# Patient Record
Sex: Male | Born: 1955 | Race: White | Hispanic: No | Marital: Married | Smoking: Never smoker
Health system: Southern US, Community
[De-identification: ages and names within clinical notes are randomized; demographics above are authoritative.]

## PROBLEM LIST (undated history)

## (undated) DIAGNOSIS — M1990 Unspecified osteoarthritis, unspecified site: Secondary | ICD-10-CM

## (undated) DIAGNOSIS — I10 Essential (primary) hypertension: Secondary | ICD-10-CM

## (undated) HISTORY — PX: NO PAST SURGERIES: SHX2092

---

## 2001-01-26 ENCOUNTER — Ambulatory Visit (HOSPITAL_BASED_OUTPATIENT_CLINIC_OR_DEPARTMENT_OTHER): Admission: RE | Admit: 2001-01-26 | Discharge: 2001-01-26 | Payer: Self-pay | Admitting: *Deleted

## 2009-02-01 ENCOUNTER — Ambulatory Visit (HOSPITAL_COMMUNITY): Admission: RE | Admit: 2009-02-01 | Discharge: 2009-02-01 | Payer: Self-pay | Admitting: Family Medicine

## 2011-07-04 IMAGING — CR DG CHEST 2V
2 series · 2 of 2 positions shown · non-contrast
Comparison: None available.

CLINICAL DATA: Cough.  Abnormal physical examination of the left
chest.

CHEST - 2 VIEW

[view not recorded (1 of 2)]
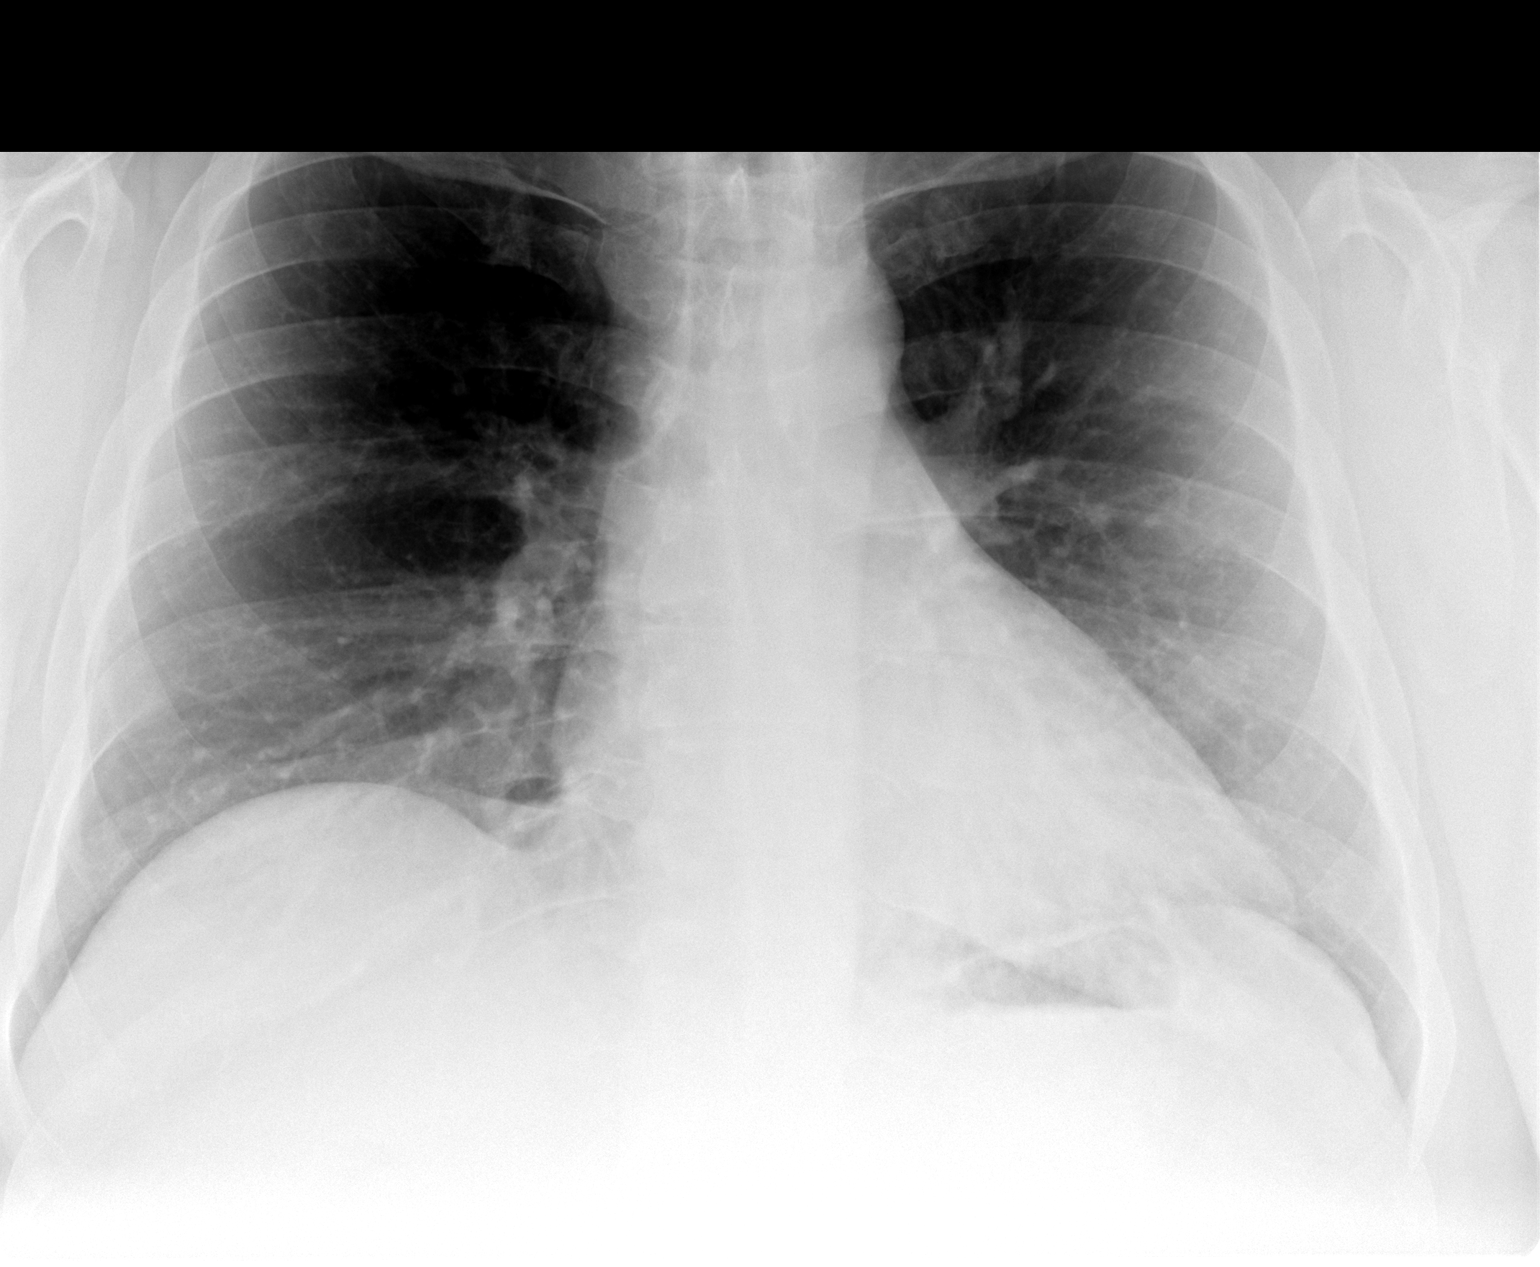

[view not recorded (2 of 2)]
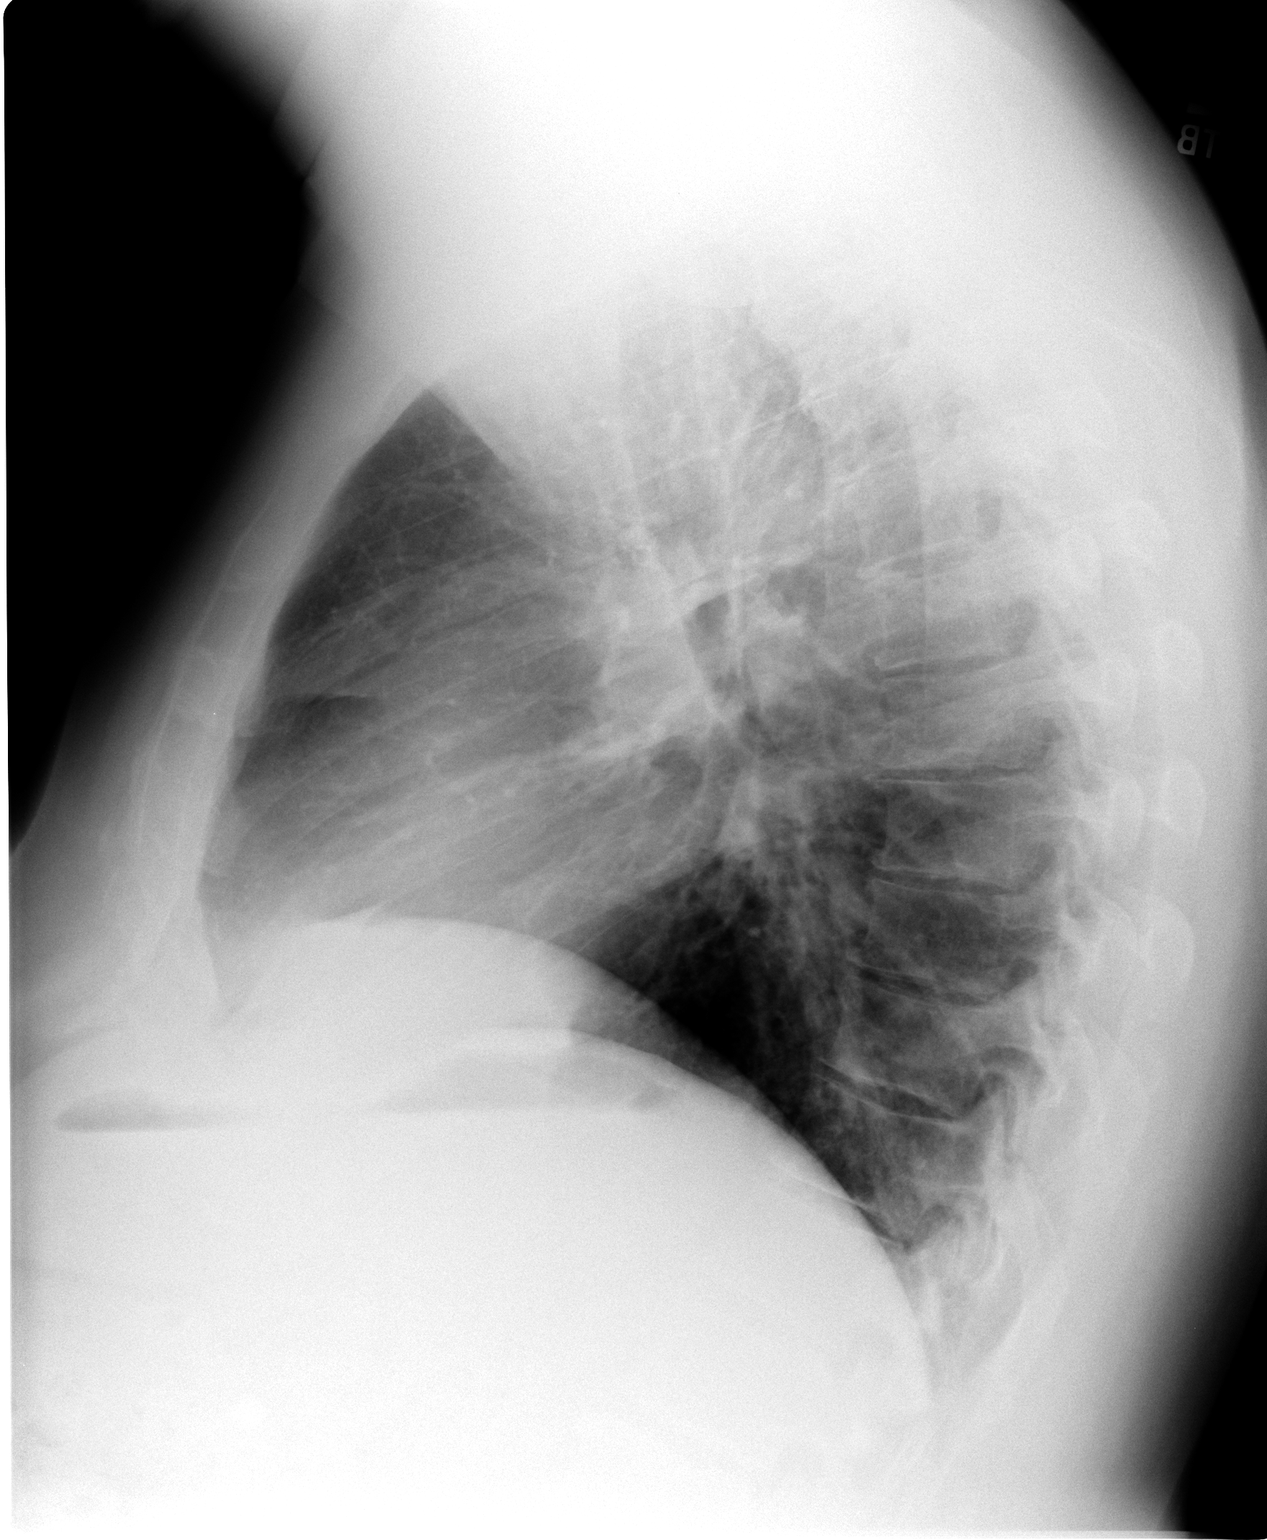

[2 of 2 positions shown; findings below may reference images not displayed]

FINDINGS: The lungs are clear.  There is mild peribronchial
thickening.  No pleural effusion.  Heart size normal.
IMPRESSION: Peribronchial thickening compatible with bronchitis.  No focal
airspace disease.

## 2016-03-25 ENCOUNTER — Ambulatory Visit: Payer: Self-pay | Admitting: Orthopedic Surgery

## 2016-04-07 ENCOUNTER — Ambulatory Visit: Payer: Self-pay | Admitting: Orthopedic Surgery

## 2016-04-07 NOTE — H&P (Signed)
TOTAL HIP ADMISSION H&P  Patient is admitted for right total hip arthroplasty.  Subjective:  Chief Complaint: right hip pain  HPI: Kevin Mcmahon, 60 y.o. male, has a history of pain and functional disability in the right hip(s) due to arthritis and patient has failed non-surgical conservative treatments for greater than 12 weeks to include NSAID's and/or analgesics, corticosteriod injections, flexibility and strengthening excercises, use of assistive devices, weight reduction as appropriate and activity modification.  Onset of symptoms was gradual starting 1 years ago with rapidlly worsening course since that time.The patient noted no past surgery on the right hip(s).  Patient currently rates pain in the right hip at 10 out of 10 with activity. Patient has night pain, worsening of pain with activity and weight bearing, pain that interfers with activities of daily living, pain with passive range of motion and crepitus. Patient has evidence of subchondral cysts, subchondral sclerosis, periarticular osteophytes and joint space narrowing by imaging studies. This condition presents safety issues increasing the risk of falls.  There is no current active infection.  There are no active problems to display for this patient.  No past medical history on file.  No past surgical history on file.   (Not in a hospital admission) Allergies not on file  Social History  Substance Use Topics  . Smoking status: Not on file  . Smokeless tobacco: Not on file  . Alcohol use Not on file    No family history on file.   Review of Systems  Constitutional: Negative.   HENT: Negative.   Eyes: Negative.   Respiratory: Negative.   Cardiovascular: Negative.   Gastrointestinal: Negative.   Genitourinary: Negative.   Musculoskeletal: Positive for joint pain.  Skin: Negative.   Neurological: Negative.   Endo/Heme/Allergies: Negative.   Psychiatric/Behavioral: Negative.     Objective:  Physical Exam   Vitals reviewed. Constitutional: He is oriented to person, place, and time. He appears well-developed and well-nourished.  HENT:  Head: Normocephalic and atraumatic.  Eyes: Conjunctivae and EOM are normal. Pupils are equal, round, and reactive to light.  Neck: Normal range of motion. Neck supple.  Cardiovascular: Normal rate, regular rhythm and intact distal pulses.   Respiratory: Effort normal and breath sounds normal. No respiratory distress.  GI: Soft. Bowel sounds are normal. He exhibits no distension.  Genitourinary:  Genitourinary Comments: deferred  Musculoskeletal:       Right hip: He exhibits decreased range of motion.  Neurological: He is alert and oriented to person, place, and time. He has normal reflexes.  Skin: Skin is warm and dry.  Psychiatric: He has a normal mood and affect. His behavior is normal. Judgment and thought content normal.    Vital signs in last 24 hours: @VSRANGES @  Labs:   There is no height or weight on file to calculate BMI.   Imaging Review Plain radiographs demonstrate severe degenerative joint disease of the right hip(s). The bone quality appears to be adequate for age and reported activity level.  Assessment/Plan:  End stage arthritis, right hip(s)  The patient history, physical examination, clinical judgement of the provider and imaging studies are consistent with end stage degenerative joint disease of the right hip(s) and total hip arthroplasty is deemed medically necessary. The treatment options including medical management, injection therapy, arthroscopy and arthroplasty were discussed at length. The risks and benefits of total hip arthroplasty were presented and reviewed. The risks due to aseptic loosening, infection, stiffness, dislocation/subluxation,  thromboembolic complications and other imponderables were discussed.  The patient acknowledged the explanation, agreed to proceed with the plan and consent was signed. Patient is being  admitted for inpatient treatment for surgery, pain control, PT, OT, prophylactic antibiotics, VTE prophylaxis, progressive ambulation and ADL's and discharge planning.The patient is planning to be discharged home with home health services

## 2016-04-17 ENCOUNTER — Other Ambulatory Visit (HOSPITAL_COMMUNITY): Payer: Self-pay | Admitting: *Deleted

## 2016-04-17 ENCOUNTER — Encounter (HOSPITAL_COMMUNITY)
Admission: RE | Admit: 2016-04-17 | Discharge: 2016-04-17 | Disposition: A | Discharge status: Home / Self Care | Payer: 59 | Source: Ambulatory Visit | Attending: Orthopedic Surgery | Admitting: Orthopedic Surgery

## 2016-04-17 ENCOUNTER — Encounter (HOSPITAL_COMMUNITY): Payer: Self-pay

## 2016-04-17 DIAGNOSIS — Z01818 Encounter for other preprocedural examination: Secondary | ICD-10-CM | POA: Diagnosis not present

## 2016-04-17 HISTORY — DX: Unspecified osteoarthritis, unspecified site: M19.90

## 2016-04-17 HISTORY — DX: Essential (primary) hypertension: I10

## 2016-04-17 LAB — CBC
HEMATOCRIT: 40.9 % (ref 39.0–52.0)
Hemoglobin: 13.3 g/dL (ref 13.0–17.0)
MCH: 27.8 pg (ref 26.0–34.0)
MCHC: 32.5 g/dL (ref 30.0–36.0)
MCV: 85.4 fL (ref 78.0–100.0)
Platelets: 214 10*3/uL (ref 150–400)
RBC: 4.79 MIL/uL (ref 4.22–5.81)
RDW: 13.9 % (ref 11.5–15.5)
WBC: 6.8 10*3/uL (ref 4.0–10.5)

## 2016-04-17 LAB — BASIC METABOLIC PANEL
ANION GAP: 7 (ref 5–15)
BUN: 19 mg/dL (ref 6–20)
CO2: 24 mmol/L (ref 22–32)
Calcium: 9 mg/dL (ref 8.9–10.3)
Chloride: 107 mmol/L (ref 101–111)
Creatinine, Ser: 0.83 mg/dL (ref 0.61–1.24)
GFR calc non Af Amer: >60 mL/min (ref >60)
Glucose, Bld: 112 mg/dL — ABNORMAL HIGH (ref 65–99)
Potassium: 3.9 mmol/L (ref 3.5–5.1)
SODIUM: 138 mmol/L (ref 135–145)

## 2016-04-17 LAB — TYPE AND SCREEN
ABO/RH(D): A POS
ANTIBODY SCREEN: NEGATIVE

## 2016-04-17 LAB — SURGICAL PCR SCREEN
MRSA, PCR: NEGATIVE
Staphylococcus aureus: NEGATIVE

## 2016-04-17 LAB — ABO/RH: ABO/RH(D): A POS

## 2016-04-17 NOTE — Progress Notes (Signed)
This pt. Has tested at an elevated risk for Obstructive Sleep Apnea during a pre-surgical visit. A score of 5 or greater is considered an elevated risk.

## 2016-04-17 NOTE — Pre-Procedure Instructions (Signed)
    Kevin Mcmahon  04/17/2016      [id number] - [place], [place] - [id number]  [place] [id number] Phone: [phone] Fax: [phone]   Your procedure is scheduled on 04-27-2016   Monday .  [facility] Admitting at 5:30 A.M   Call this number if you have problems the morning of surgery:  [phone]   Remember:  Do not eat food or drink liquids after midnight   Take these medicines the morning of surgery with A SIP OF WATER none             STOP ASPIRIN,ANTIINFLAMATORIES (IBUPROFEN,ALEVE,MOTRIN,ADVIL,GOODY'S POWDERS),HERBAL SUPPLEMENTS,FISH OIL,AND VITAMINS 5-7 DAYS PRIOR TO SURGERY     Do not wear jewelry,.  Do not wear lotions, powders, or perfumes, or deoderant.  Do not shave 48 hours prior to surgery.  Men may shave face and neck.   Do not bring valuables to the hospital.  [facility] is not responsible for any belongings or valuables.  Contacts, dentures or bridgework may not be worn into surgery.  Leave your suitcase in the car.  After surgery it may be brought to your room.  For patients admitted to the hospital, discharge time will be determined by your treatment team.  Patients discharged the day of surgery will not be allowed to drive home.    Special instructions:  See attached Sheet for instructions on CHG showers  Please read over the following fact sheets that you were given. Incentive Spirometry

## 2016-04-24 MED ORDER — TRANEXAMIC ACID 1000 MG/10ML IV SOLN
1000.0000 mg | INTRAVENOUS | Status: AC
  Administered 2016-04-27: 1000 mg via INTRAVENOUS
  Filled 2016-04-24: qty 10

## 2016-04-26 ENCOUNTER — Encounter (HOSPITAL_COMMUNITY): Payer: Self-pay | Admitting: Anesthesiology

## 2016-04-26 MED ORDER — DEXTROSE 5 % IV SOLN
3.0000 g | INTRAVENOUS | Status: AC
  Administered 2016-04-27: 3 g via INTRAVENOUS
  Filled 2016-04-26: qty 3000

## 2016-04-26 NOTE — Anesthesia Preprocedure Evaluation (Addendum)
Anesthesia Evaluation  Patient identified by MRN, date of birth, ID band Patient awake    Reviewed: Allergy & Precautions, NPO status , Patient's Chart, lab work & pertinent test results  Airway Mallampati: II       Dental no notable dental hx.    Pulmonary neg pulmonary ROS,    Pulmonary exam normal        Cardiovascular hypertension, Normal cardiovascular exam     Neuro/Psych negative neurological ROS  negative psych ROS   GI/Hepatic negative GI ROS, Neg liver ROS,   Endo/Other  Morbid obesity  Renal/GU negative Renal ROS     Musculoskeletal   Abdominal (+) + obese,   Peds negative pediatric ROS (+)  Hematology negative hematology ROS (+)   Anesthesia Other Findings   Reproductive/Obstetrics negative OB ROS                           Anesthesia Physical Anesthesia Plan  ASA: III  Anesthesia Plan: Spinal   Post-op Pain Management:    Induction:   Airway Management Planned:   Additional Equipment:   Intra-op Plan:   Post-operative Plan:   Informed Consent: I have reviewed the patients History and Physical, chart, labs and discussed the procedure including the risks, benefits and alternatives for the proposed anesthesia with the patient or authorized representative who has indicated his/her understanding and acceptance.     Plan Discussed with: CRNA and Surgeon  Anesthesia Plan Comments:        Anesthesia Quick Evaluation

## 2016-04-27 ENCOUNTER — Inpatient Hospital Stay (HOSPITAL_COMMUNITY): Payer: 59 | Admitting: Anesthesiology

## 2016-04-27 ENCOUNTER — Inpatient Hospital Stay (HOSPITAL_COMMUNITY): Payer: 59

## 2016-04-27 ENCOUNTER — Inpatient Hospital Stay (HOSPITAL_COMMUNITY)
Admission: RE | Admit: 2016-04-27 | Discharge: 2016-04-28 | DRG: 470 | Disposition: A | Discharge status: Home / Self Care | Payer: 59 | Source: Ambulatory Visit | Attending: Orthopedic Surgery | Admitting: Orthopedic Surgery

## 2016-04-27 ENCOUNTER — Encounter (HOSPITAL_COMMUNITY): Payer: Self-pay | Admitting: *Deleted

## 2016-04-27 ENCOUNTER — Encounter (HOSPITAL_COMMUNITY)
Admission: RE | Disposition: A | Discharge status: Home / Self Care | Payer: Self-pay | Source: Ambulatory Visit | Attending: Orthopedic Surgery

## 2016-04-27 DIAGNOSIS — M25551 Pain in right hip: Secondary | ICD-10-CM | POA: Diagnosis present

## 2016-04-27 DIAGNOSIS — I10 Essential (primary) hypertension: Secondary | ICD-10-CM | POA: Diagnosis present

## 2016-04-27 DIAGNOSIS — Z6839 Body mass index (BMI) 39.0-39.9, adult: Secondary | ICD-10-CM | POA: Diagnosis not present

## 2016-04-27 DIAGNOSIS — Z09 Encounter for follow-up examination after completed treatment for conditions other than malignant neoplasm: Secondary | ICD-10-CM

## 2016-04-27 DIAGNOSIS — Z419 Encounter for procedure for purposes other than remedying health state, unspecified: Secondary | ICD-10-CM

## 2016-04-27 DIAGNOSIS — M25751 Osteophyte, right hip: Secondary | ICD-10-CM | POA: Diagnosis present

## 2016-04-27 DIAGNOSIS — M1611 Unilateral primary osteoarthritis, right hip: Secondary | ICD-10-CM | POA: Diagnosis present

## 2016-04-27 HISTORY — PX: TOTAL HIP ARTHROPLASTY: SHX124

## 2016-04-27 SURGERY — ARTHROPLASTY, HIP, TOTAL, ANTERIOR APPROACH
Anesthesia: Spinal | Site: Hip | Laterality: Right

## 2016-04-27 MED ORDER — LIDOCAINE 2% (20 MG/ML) 5 ML SYRINGE
INTRAMUSCULAR | Status: AC
  Filled 2016-04-27: qty 5

## 2016-04-27 MED ORDER — MENTHOL 3 MG MT LOZG: 1.0000 | LOZENGE | OROMUCOSAL | Status: DC | PRN

## 2016-04-27 MED ORDER — CHLORHEXIDINE GLUCONATE 4 % EX LIQD: 60.0000 mL | Freq: Once | CUTANEOUS | Status: DC

## 2016-04-27 MED ORDER — DEXAMETHASONE SODIUM PHOSPHATE 10 MG/ML IJ SOLN
10.0000 mg | Freq: Once | INTRAMUSCULAR | Status: AC
Start: 2016-04-28 — End: 2016-04-28
  Administered 2016-04-28: 10 mg via INTRAVENOUS
  Filled 2016-04-27: qty 1

## 2016-04-27 MED ORDER — BUPIVACAINE IN DEXTROSE 0.75-8.25 % IT SOLN
INTRATHECAL | Status: DC | PRN
  Administered 2016-04-27: 2 mL via INTRATHECAL

## 2016-04-27 MED ORDER — SODIUM CHLORIDE 0.9 % IV SOLN
INTRAVENOUS | Status: DC
Start: 2016-04-27 — End: 2016-04-28
  Administered 2016-04-27: 16:00:00 via INTRAVENOUS

## 2016-04-27 MED ORDER — KETOROLAC TROMETHAMINE 30 MG/ML IJ SOLN: 30.0000 mg | Freq: Once | INTRAMUSCULAR | Status: DC

## 2016-04-27 MED ORDER — POVIDONE-IODINE 10 % EX SWAB: 2.0000 "application " | Freq: Once | CUTANEOUS | Status: DC

## 2016-04-27 MED ORDER — MEPERIDINE HCL 25 MG/ML IJ SOLN: 6.2500 mg | INTRAMUSCULAR | Status: DC | PRN

## 2016-04-27 MED ORDER — FENTANYL CITRATE (PF) 100 MCG/2ML IJ SOLN
INTRAMUSCULAR | Status: DC | PRN
  Administered 2016-04-27 (×2): 25 ug via INTRAVENOUS
  Administered 2016-04-27: 50 ug via INTRAVENOUS

## 2016-04-27 MED ORDER — KETOROLAC TROMETHAMINE 15 MG/ML IJ SOLN
15.0000 mg | Freq: Four times a day (QID) | INTRAMUSCULAR | Status: AC
  Administered 2016-04-28 (×2): 15 mg via INTRAVENOUS
  Filled 2016-04-27 (×3): qty 1

## 2016-04-27 MED ORDER — HYDROMORPHONE HCL 1 MG/ML IJ SOLN
0.5000 mg | INTRAMUSCULAR | Status: DC | PRN
  Administered 2016-04-27: 1 mg via INTRAVENOUS
  Filled 2016-04-27: qty 1

## 2016-04-27 MED ORDER — BUPIVACAINE-EPINEPHRINE 0.5% -1:200000 IJ SOLN
INTRAMUSCULAR | Status: DC | PRN
  Administered 2016-04-27: 30 mL

## 2016-04-27 MED ORDER — PROPOFOL 500 MG/50ML IV EMUL
INTRAVENOUS | Status: DC | PRN
  Administered 2016-04-27: 75 ug/kg/min via INTRAVENOUS

## 2016-04-27 MED ORDER — LACTATED RINGERS IV SOLN
INTRAVENOUS | Status: DC | PRN
  Administered 2016-04-27 (×2): via INTRAVENOUS

## 2016-04-27 MED ORDER — EPHEDRINE SULFATE 50 MG/ML IJ SOLN
INTRAMUSCULAR | Status: DC | PRN
  Administered 2016-04-27 (×3): 10 mg via INTRAVENOUS

## 2016-04-27 MED ORDER — HYDROMORPHONE HCL 1 MG/ML IJ SOLN
INTRAMUSCULAR | Status: AC
  Filled 2016-04-27: qty 1

## 2016-04-27 MED ORDER — HYDROCODONE-ACETAMINOPHEN 5-325 MG PO TABS
1.0000 | ORAL_TABLET | ORAL | Status: DC | PRN
  Administered 2016-04-27: 1 via ORAL
  Filled 2016-04-27: qty 2
  Filled 2016-04-27: qty 1

## 2016-04-27 MED ORDER — SENNA 8.6 MG PO TABS
2.0000 | ORAL_TABLET | Freq: Every day | ORAL | Status: DC
  Administered 2016-04-27: 17.2 mg via ORAL
  Filled 2016-04-27: qty 2

## 2016-04-27 MED ORDER — METHOCARBAMOL 500 MG PO TABS
500.0000 mg | ORAL_TABLET | Freq: Four times a day (QID) | ORAL | Status: DC | PRN
  Filled 2016-04-27: qty 1

## 2016-04-27 MED ORDER — ONDANSETRON HCL 4 MG PO TABS: 4.0000 mg | ORAL_TABLET | Freq: Four times a day (QID) | ORAL | Status: DC | PRN

## 2016-04-27 MED ORDER — ASPIRIN 81 MG PO CHEW
81.0000 mg | CHEWABLE_TABLET | Freq: Two times a day (BID) | ORAL | Status: DC
  Administered 2016-04-27 – 2016-04-28 (×2): 81 mg via ORAL
  Filled 2016-04-27 (×2): qty 1

## 2016-04-27 MED ORDER — PROMETHAZINE HCL 25 MG/ML IJ SOLN: 6.2500 mg | INTRAMUSCULAR | Status: DC | PRN

## 2016-04-27 MED ORDER — PROPOFOL 10 MG/ML IV BOLUS
INTRAVENOUS | Status: DC | PRN
  Administered 2016-04-27: 20 mg via INTRAVENOUS

## 2016-04-27 MED ORDER — BUPIVACAINE LIPOSOME 1.3 % IJ SUSP
INTRAMUSCULAR | Status: DC | PRN
  Administered 2016-04-27: 20 mL

## 2016-04-27 MED ORDER — EPHEDRINE 5 MG/ML INJ
INTRAVENOUS | Status: AC
  Filled 2016-04-27: qty 10

## 2016-04-27 MED ORDER — HYDROMORPHONE HCL 1 MG/ML IJ SOLN
2.0000 mg | INTRAMUSCULAR | Status: DC | PRN
  Administered 2016-04-27: 0.5 mg via INTRAVENOUS

## 2016-04-27 MED ORDER — PHENYLEPHRINE 40 MCG/ML (10ML) SYRINGE FOR IV PUSH (FOR BLOOD PRESSURE SUPPORT)
PREFILLED_SYRINGE | INTRAVENOUS | Status: AC
Start: 2016-04-27 — End: 2016-04-27
  Filled 2016-04-27: qty 10

## 2016-04-27 MED ORDER — BUPIVACAINE-EPINEPHRINE (PF) 0.5% -1:200000 IJ SOLN
INTRAMUSCULAR | Status: AC
Start: 2016-04-27 — End: 2016-04-27
  Filled 2016-04-27: qty 30

## 2016-04-27 MED ORDER — METOCLOPRAMIDE HCL 5 MG PO TABS: 5.0000 mg | ORAL_TABLET | Freq: Three times a day (TID) | ORAL | Status: DC | PRN

## 2016-04-27 MED ORDER — SODIUM CHLORIDE 0.9 % IR SOLN
Status: DC | PRN
  Administered 2016-04-27: 3000 mL

## 2016-04-27 MED ORDER — KETOROLAC TROMETHAMINE 30 MG/ML IJ SOLN
INTRAMUSCULAR | Status: DC | PRN
  Administered 2016-04-27: 30 mg

## 2016-04-27 MED ORDER — ACETAMINOPHEN 10 MG/ML IV SOLN
1000.0000 mg | INTRAVENOUS | Status: AC
  Administered 2016-04-27: 1000 mg via INTRAVENOUS
  Filled 2016-04-27: qty 100

## 2016-04-27 MED ORDER — CEFAZOLIN SODIUM-DEXTROSE 2-4 GM/100ML-% IV SOLN
2.0000 g | Freq: Four times a day (QID) | INTRAVENOUS | Status: AC
  Administered 2016-04-27: 2 g via INTRAVENOUS
  Filled 2016-04-27 (×2): qty 100

## 2016-04-27 MED ORDER — SODIUM CHLORIDE 0.9 % IV SOLN
INTRAVENOUS | Status: DC | PRN
  Administered 2016-04-27: 1000 mL

## 2016-04-27 MED ORDER — SODIUM CHLORIDE 0.9 % IV SOLN: INTRAVENOUS | Status: DC

## 2016-04-27 MED ORDER — PROPOFOL 10 MG/ML IV BOLUS
INTRAVENOUS | Status: AC
  Filled 2016-04-27: qty 20

## 2016-04-27 MED ORDER — DOCUSATE SODIUM 100 MG PO CAPS
100.0000 mg | ORAL_CAPSULE | Freq: Two times a day (BID) | ORAL | Status: DC
  Administered 2016-04-27 – 2016-04-28 (×2): 100 mg via ORAL
  Filled 2016-04-27 (×2): qty 1

## 2016-04-27 MED ORDER — TRANEXAMIC ACID 1000 MG/10ML IV SOLN
1000.0000 mg | Freq: Once | INTRAVENOUS | Status: AC
  Administered 2016-04-27: 1000 mg via INTRAVENOUS
  Filled 2016-04-27: qty 10

## 2016-04-27 MED ORDER — METOCLOPRAMIDE HCL 5 MG/ML IJ SOLN: 5.0000 mg | Freq: Three times a day (TID) | INTRAMUSCULAR | Status: DC | PRN

## 2016-04-27 MED ORDER — LIDOCAINE HCL (CARDIAC) 20 MG/ML IV SOLN
INTRAVENOUS | Status: DC | PRN
  Administered 2016-04-27: 40 mg via INTRATRACHEAL

## 2016-04-27 MED ORDER — HYDROMORPHONE HCL 1 MG/ML IJ SOLN: 0.2500 mg | INTRAMUSCULAR | Status: DC | PRN

## 2016-04-27 MED ORDER — ACETAMINOPHEN 325 MG PO TABS: 650.0000 mg | ORAL_TABLET | Freq: Four times a day (QID) | ORAL | Status: DC | PRN

## 2016-04-27 MED ORDER — METHOCARBAMOL 1000 MG/10ML IJ SOLN
500.0000 mg | Freq: Four times a day (QID) | INTRAMUSCULAR | Status: DC | PRN
  Filled 2016-04-27: qty 5

## 2016-04-27 MED ORDER — SODIUM CHLORIDE 0.9 % IJ SOLN
INTRAMUSCULAR | Status: DC | PRN
  Administered 2016-04-27: 30 mL

## 2016-04-27 MED ORDER — FENTANYL CITRATE (PF) 100 MCG/2ML IJ SOLN
INTRAMUSCULAR | Status: AC
  Filled 2016-04-27: qty 2

## 2016-04-27 MED ORDER — IRBESARTAN 150 MG PO TABS
150.0000 mg | ORAL_TABLET | Freq: Every day | ORAL | Status: DC
  Administered 2016-04-28: 150 mg via ORAL
  Filled 2016-04-27: qty 1

## 2016-04-27 MED ORDER — POLYETHYLENE GLYCOL 3350 17 G PO PACK: 17.0000 g | PACK | Freq: Every day | ORAL | Status: DC | PRN

## 2016-04-27 MED ORDER — ACETAMINOPHEN 650 MG RE SUPP: 650.0000 mg | Freq: Four times a day (QID) | RECTAL | Status: DC | PRN

## 2016-04-27 MED ORDER — KETOROLAC TROMETHAMINE 30 MG/ML IJ SOLN
INTRAMUSCULAR | Status: AC
  Filled 2016-04-27: qty 1

## 2016-04-27 MED ORDER — MIDAZOLAM HCL 2 MG/2ML IJ SOLN
INTRAMUSCULAR | Status: AC
  Filled 2016-04-27: qty 2

## 2016-04-27 MED ORDER — MIDAZOLAM HCL 5 MG/5ML IJ SOLN
INTRAMUSCULAR | Status: DC | PRN
  Administered 2016-04-27: 2 mg via INTRAVENOUS

## 2016-04-27 MED ORDER — 0.9 % SODIUM CHLORIDE (POUR BTL) OPTIME
TOPICAL | Status: DC | PRN
  Administered 2016-04-27: 1000 mL

## 2016-04-27 MED ORDER — PHENOL 1.4 % MT LIQD: 1.0000 | OROMUCOSAL | Status: DC | PRN

## 2016-04-27 MED ORDER — POVIDONE-IODINE 7.5 % EX SOLN
CUTANEOUS | Status: DC | PRN
  Administered 2016-04-27: 1 via TOPICAL

## 2016-04-27 MED ORDER — ONDANSETRON HCL 4 MG/2ML IJ SOLN: 4.0000 mg | Freq: Four times a day (QID) | INTRAMUSCULAR | Status: DC | PRN

## 2016-04-27 SURGICAL SUPPLY — 58 items
ADH SKN CLS APL DERMABOND .7 (GAUZE/BANDAGES/DRESSINGS) ×2
ADH SKN CLS LQ APL DERMABOND (GAUZE/BANDAGES/DRESSINGS) ×1
ALCOHOL ISOPROPYL (RUBBING) (MISCELLANEOUS) ×3 IMPLANT
BLADE SURG ROTATE 9660 (MISCELLANEOUS) IMPLANT
CAPT HIP TOTAL 2 ×2 IMPLANT
CHLORAPREP W/TINT 26ML (MISCELLANEOUS) ×3 IMPLANT
COVER SURGICAL LIGHT HANDLE (MISCELLANEOUS) ×3 IMPLANT
DERMABOND ADHESIVE PROPEN (GAUZE/BANDAGES/DRESSINGS) ×2
DERMABOND ADVANCED (GAUZE/BANDAGES/DRESSINGS) ×4
DERMABOND ADVANCED .7 DNX12 (GAUZE/BANDAGES/DRESSINGS) ×2 IMPLANT
DERMABOND ADVANCED .7 DNX6 (GAUZE/BANDAGES/DRESSINGS) IMPLANT
DRAPE C-ARM 42X72 X-RAY (DRAPES) ×3 IMPLANT
DRAPE IMP U-DRAPE 54X76 (DRAPES) ×6 IMPLANT
DRAPE STERI IOBAN 125X83 (DRAPES) ×3 IMPLANT
DRAPE U-SHAPE 47X51 STRL (DRAPES) ×9 IMPLANT
DRSG AQUACEL AG ADV 3.5X10 (GAUZE/BANDAGES/DRESSINGS) ×3 IMPLANT
ELECT BLADE 4.0 EZ CLEAN MEGAD (MISCELLANEOUS) ×3
ELECT REM PT RETURN 9FT ADLT (ELECTROSURGICAL) ×3
ELECTRODE BLDE 4.0 EZ CLN MEGD (MISCELLANEOUS) ×1 IMPLANT
ELECTRODE REM PT RTRN 9FT ADLT (ELECTROSURGICAL) ×1 IMPLANT
EVACUATOR 1/8 PVC DRAIN (DRAIN) IMPLANT
GLOVE BIO SURGEON STRL SZ8.5 (GLOVE) ×6 IMPLANT
GLOVE BIOGEL PI IND STRL 8.5 (GLOVE) ×1 IMPLANT
GLOVE BIOGEL PI INDICATOR 8.5 (GLOVE) ×2
GOWN STRL REUS W/ TWL LRG LVL3 (GOWN DISPOSABLE) ×2 IMPLANT
GOWN STRL REUS W/TWL 2XL LVL3 (GOWN DISPOSABLE) ×3 IMPLANT
GOWN STRL REUS W/TWL LRG LVL3 (GOWN DISPOSABLE) ×6
HANDPIECE INTERPULSE COAX TIP (DISPOSABLE) ×3
HOOD PEEL AWAY FACE SHEILD DIS (HOOD) ×6 IMPLANT
KIT BASIN OR (CUSTOM PROCEDURE TRAY) ×3 IMPLANT
KIT ROOM TURNOVER OR (KITS) ×3 IMPLANT
MANIFOLD NEPTUNE II (INSTRUMENTS) ×3 IMPLANT
MARKER SKIN DUAL TIP RULER LAB (MISCELLANEOUS) ×6 IMPLANT
NDL SPNL 18GX3.5 QUINCKE PK (NEEDLE) ×1 IMPLANT
NEEDLE SPNL 18GX3.5 QUINCKE PK (NEEDLE) ×3 IMPLANT
NS IRRIG 1000ML POUR BTL (IV SOLUTION) ×3 IMPLANT
PACK TOTAL JOINT (CUSTOM PROCEDURE TRAY) ×3 IMPLANT
PACK UNIVERSAL I (CUSTOM PROCEDURE TRAY) ×3 IMPLANT
PAD ARMBOARD 7.5X6 YLW CONV (MISCELLANEOUS) ×6 IMPLANT
SAW OSC TIP CART 19.5X105X1.3 (SAW) ×3 IMPLANT
SEALER BIPOLAR AQUA 6.0 (INSTRUMENTS) IMPLANT
SET HNDPC FAN SPRY TIP SCT (DISPOSABLE) ×1 IMPLANT
SOLUTION BETADINE 4OZ (MISCELLANEOUS) ×3 IMPLANT
SUCTION FRAZIER HANDLE 10FR (MISCELLANEOUS) ×2
SUCTION TUBE FRAZIER 10FR DISP (MISCELLANEOUS) ×1 IMPLANT
SUT ETHIBOND NAB CT1 #1 30IN (SUTURE) ×6 IMPLANT
SUT MNCRL AB 3-0 PS2 18 (SUTURE) ×3 IMPLANT
SUT MON AB 2-0 CT1 36 (SUTURE) ×3 IMPLANT
SUT VIC AB 1 CT1 27 (SUTURE) ×3
SUT VIC AB 1 CT1 27XBRD ANBCTR (SUTURE) ×1 IMPLANT
SUT VIC AB 2-0 CT1 27 (SUTURE) ×3
SUT VIC AB 2-0 CT1 TAPERPNT 27 (SUTURE) ×1 IMPLANT
SUT VLOC 180 0 24IN GS25 (SUTURE) ×3 IMPLANT
SYR 50ML LL SCALE MARK (SYRINGE) ×3 IMPLANT
TOWEL OR 17X24 6PK STRL BLUE (TOWEL DISPOSABLE) ×3 IMPLANT
TOWEL OR 17X26 10 PK STRL BLUE (TOWEL DISPOSABLE) ×3 IMPLANT
TRAY FOLEY CATH 16FR SILVER (SET/KITS/TRAYS/PACK) IMPLANT
WATER STERILE IRR 1000ML POUR (IV SOLUTION) ×9 IMPLANT

## 2016-04-27 NOTE — Addendum Note (Signed)
Addendum  created 04/27/16 2008 by Leilani AbleFranklin Oree Mirelez, MD   Anesthesia Intra Meds edited

## 2016-04-27 NOTE — Transfer of Care (Signed)
Immediate Anesthesia Transfer of Care Note  Patient: Kevin Mcmahon  Procedure(s) Performed: Procedure(s): RIGHT TOTAL HIP ARTHROPLASTY ANTERIOR APPROACH (Right)  Patient Location: PACU  Anesthesia Type:Spinal  Level of Consciousness: awake, alert , oriented and patient cooperative  Airway & Oxygen Therapy: Patient Spontanous Breathing and Patient connected to face mask oxygen  Post-op Assessment: Report given to RN and Post -op Vital signs reviewed and stable  Post vital signs: Reviewed and stable  Last Vitals:  Vitals:   04/27/16 0616 04/27/16 1000  BP: (!) 146/73   Pulse: 67   Resp: 20   Temp: 36.7 C 36.3 C    Last Pain:  Vitals:   04/27/16 0616  TempSrc: Oral      Patients Stated Pain Goal: 1 (04/27/16 0616)  Complications: No apparent anesthesia complications

## 2016-04-27 NOTE — Discharge Instructions (Signed)
°Dr. Roniyah Llorens °Joint Replacement Specialist °[facility] °[address] °[place], [place] [address] °[phone] ° ° °TOTAL HIP REPLACEMENT POSTOPERATIVE DIRECTIONS ° ° ° °Hip Rehabilitation, Guidelines Following Surgery  ° °WEIGHT BEARING °Weight bearing as tolerated with assist device (walker, cane, etc) as directed, use it as long as suggested by your surgeon or therapist, typically at least 4-6 weeks. ° °The results of a hip operation are greatly improved after range of motion and muscle strengthening exercises. Follow all safety measures which are given to protect your hip. If any of these exercises cause increased pain or swelling in your joint, decrease the amount until you are comfortable again. Then slowly increase the exercises. Call your caregiver if you have problems or questions.  ° °HOME CARE INSTRUCTIONS  °Most of the following instructions are designed to prevent the dislocation of your new hip.  °Remove items at home which could result in a fall. This includes throw rugs or furniture in walking pathways.  °Continue medications as instructed at time of discharge. °· You may have some home medications which will be placed on hold until you complete the course of blood thinner medication. °· You may start showering once you are discharged home. Do not remove your dressing. °Do not put on socks or shoes without following the instructions of your caregivers.   °Sit on chairs with arms. Use the chair arms to help push yourself up when arising.  °Arrange for the use of a toilet seat elevator so you are not sitting low.  °· Walk with walker as instructed.  °You may resume a sexual relationship in one month or when given the OK by your caregiver.  °Use walker as long as suggested by your caregivers.  °You may put full weight on your legs and walk as much as is comfortable. °Avoid periods of inactivity such as sitting longer than an hour when not asleep. This helps prevent  blood clots.  °You may return to work once you are cleared by your surgeon.  °Do not drive a car for 6 weeks or until released by your surgeon.  °Do not drive while taking narcotics.  °Wear elastic stockings for two weeks following surgery during the day but you may remove then at night.  °Make sure you keep all of your appointments after your operation with all of your doctors and caregivers. You should call the office at the above phone number and make an appointment for approximately two weeks after the date of your surgery. °Please pick up a stool softener and laxative for home use as long as you are requiring pain medications. °· ICE to the affected hip every three hours for 30 minutes at a time and then as needed for pain and swelling. Continue to use ice on the hip for pain and swelling from surgery. You may notice swelling that will progress down to the foot and ankle.  This is normal after surgery.  Elevate the leg when you are not up walking on it.   °It is important for you to complete the blood thinner medication as prescribed by your doctor. °· Continue to use the breathing machine which will help keep your temperature down.  It is common for your temperature to cycle up and down following surgery, especially at night when you are not up moving around and exerting yourself.  The breathing machine keeps your lungs expanded and your temperature down. ° °RANGE OF MOTION AND STRENGTHENING EXERCISES  °These exercises are   designed to help you keep full movement of your hip joint. Follow your caregiver's or physical therapist's instructions. Perform all exercises about fifteen times, three times per day or as directed. Exercise both hips, even if you have had only one joint replacement. These exercises can be done on a training (exercise) mat, on the floor, on a table or on a bed. Use whatever works the best and is most comfortable for you. Use music or television while you are exercising so that the exercises  are a pleasant break in your day. This will make your life better with the exercises acting as a break in routine you can look forward to.  °Lying on your back, slowly slide your foot toward your buttocks, raising your knee up off the floor. Then slowly slide your foot back down until your leg is straight again.  °Lying on your back spread your legs as far apart as you can without causing discomfort.  °Lying on your side, raise your upper leg and foot straight up from the floor as far as is comfortable. Slowly lower the leg and repeat.  °Lying on your back, tighten up the muscle in the front of your thigh (quadriceps muscles). You can do this by keeping your leg straight and trying to raise your heel off the floor. This helps strengthen the largest muscle supporting your knee.  °Lying on your back, tighten up the muscles of your buttocks both with the legs straight and with the knee bent at a comfortable angle while keeping your heel on the floor.  ° °SKILLED REHAB INSTRUCTIONS: °If the patient is transferred to a skilled rehab facility following release from the hospital, a list of the current medications will be sent to the facility for the patient to continue.  When discharged from the skilled rehab facility, please have the facility set up the patient's Home Health Physical Therapy prior to being released. Also, the skilled facility will be responsible for providing the patient with their medications at time of release from the facility to include their pain medication and their blood thinner medication. If the patient is still at the rehab facility at time of the two week follow up appointment, the skilled rehab facility will also need to assist the patient in arranging follow up appointment in our office and any transportation needs. ° °MAKE SURE YOU:  °Understand these instructions.  °Will watch your condition.  °Will get help right away if you are not doing well or get worse. ° °Pick up stool softner and  laxative for home use following surgery while on pain medications. °Do not remove your dressing. °The dressing is waterproof--it is OK to take showers. °Continue to use ice for pain and swelling after surgery. °Do not use any lotions or creams on the incision until instructed by your surgeon. °Total Hip Protocol. ° ° °

## 2016-04-27 NOTE — Anesthesia Procedure Notes (Addendum)
Spinal  Patient location during procedure: OR Start time: 04/27/2016 7:30 AM End time: 04/27/2016 7:33 AM Staffing Anesthesiologist: Leilani AbleHATCHETT, Christine Morton Performed: anesthesiologist  Preanesthetic Checklist Completed: patient identified, surgical consent, pre-op evaluation, timeout performed, IV checked, risks and benefits discussed and monitors and equipment checked Spinal Block Patient position: sitting Prep: site prepped and draped and DuraPrep Patient monitoring: heart rate, cardiac monitor, continuous pulse ox and blood pressure Approach: midline Location: L3-4 Injection technique: single-shot Needle Needle type: Sprotte  Needle gauge: 24 G Needle length: 9 cm Needle insertion depth: 6 cm Assessment Sensory level: T8

## 2016-04-27 NOTE — Op Note (Signed)
OPERATIVE REPORT  SURGEON: Samson FredericBrian Jermarion Poffenberger, MD   ASSISTANT: April Green, RNFA.  PREOPERATIVE DIAGNOSIS: Right hip arthritis.   POSTOPERATIVE DIAGNOSIS: Right hip arthritis.   PROCEDURE: Right total hip arthroplasty, anterior approach.   IMPLANTS: DePuy Tri Lock stem, size 7, hi offset. DePuy Pinnacle Cup, size 60 mm. DePuy Altrx liner, size 36 by 60 mm, neutral. DePuy Biolox ceramic head ball, size 36 + 1.5 mm.  ANESTHESIA:  Spinal  ESTIMATED BLOOD LOSS: 350 mL.  ANTIBIOTICS: 3 g Ancef.  DRAINS: None.  COMPLICATIONS: None.   CONDITION: PACU - hemodynamically stable.Marland Kitchen.   BRIEF CLINICAL NOTE: Kevin Mcmahon is a 60 y.o. male with a long-standing history of Right hip arthritis. After failing conservative management, the patient was indicated for total hip arthroplasty. The risks, benefits, and alternatives to the procedure were explained, and the patient elected to proceed.  PROCEDURE IN DETAIL: Surgical site was marked by myself. Spinal anesthesia was obtained in the pre-op holding area. Once inside the operative room, a foley catheter was inserted. The patient was then positioned on the Hana table. All bony prominences were well padded. The hip was prepped and draped in the normal sterile surgical fashion. A time-out was called verifying side and site of surgery. The patient received IV antibiotics within 60 minutes of beginning the procedure.  The direct anterior approach to the hip was performed through the Hueter interval. Lateral femoral circumflex vessels were treated with the Auqumantys. The anterior capsule was exposed and an inverted T capsulotomy was made.The femoral neck cut was made to the level of the templated cut. A corkscrew was placed into the head and the head was removed. The femoral head was found to have eburnated bone. The head was passed to the back table and was measured.  Acetabular exposure was achieved, and the pulvinar and labrum were  excised. Sequental reaming of the acetabulum was then performed up to a size 59 mm reamer. A 60 mm cup was then opened and impacted into place at approximately 40 degrees of abduction and 20 degrees of anteversion. The final polyethylene liner was impacted into place and acetabular osteophytes were removed.   I then gained femoral exposure taking care to protect the abductors and greater trochanter. This was performed using standard external rotation, extension, and adduction. The capsule was peeled off the inner aspect of the greater trochanter, taking care to preserve the short external rotators. A cookie cutter was used to enter the femoral canal, and then the femoral canal finder was placed. Sequential broaching was performed up to a size 7. Calcar planer was used on the femoral neck remnant. I placed a hi offset neck and a trial head ball. The hip was reduced. Leg lengths and offset were checked fluoroscopically. The hip was dislocated and trial components were removed. The final implants were placed, and the hip was reduced.  Fluoroscopy was used to confirm component position and leg lengths. At 90 degrees of external rotation and full extension, the hip was stable to an anterior directed force.  The wound was copiously irrigated with a dilute betadine solution followed by normal saline. Marcaine solution was injected into the periarticular soft tissue. The wound was closed in layers using #1 Vicryl and V-Loc for the fascia, 2-0 Vicryl for the subcutaneous fat, 2-0 Monocryl for the deep dermal layer, 3-0 running Monocryl subcuticular stitch, and Dermabond for the skin. Once the glue was fully dried, an Aquacell Ag dressing was applied. The patient was transported to the recovery  room in stable condition. Sponge, needle, and instrument counts were correct at the end of the case x2. The patient tolerated the procedure well and there were no known complications.

## 2016-04-27 NOTE — H&P (View-Only) (Signed)
TOTAL HIP ADMISSION H&P  Patient is admitted for right total hip arthroplasty.  Subjective:  Chief Complaint: right hip pain  HPI: Kevin Mcmahon, 60 y.o. male, has a history of pain and functional disability in the right hip(s) due to arthritis and patient has failed non-surgical conservative treatments for greater than 12 weeks to include NSAID's and/or analgesics, corticosteriod injections, flexibility and strengthening excercises, use of assistive devices, weight reduction as appropriate and activity modification.  Onset of symptoms was gradual starting 1 years ago with rapidlly worsening course since that time.The patient noted no past surgery on the right hip(s).  Patient currently rates pain in the right hip at 10 out of 10 with activity. Patient has night pain, worsening of pain with activity and weight bearing, pain that interfers with activities of daily living, pain with passive range of motion and crepitus. Patient has evidence of subchondral cysts, subchondral sclerosis, periarticular osteophytes and joint space narrowing by imaging studies. This condition presents safety issues increasing the risk of falls.  There is no current active infection.  There are no active problems to display for this patient.  No past medical history on file.  No past surgical history on file.   (Not in a hospital admission) Allergies not on file  Social History  Substance Use Topics  . Smoking status: Not on file  . Smokeless tobacco: Not on file  . Alcohol use Not on file    No family history on file.   Review of Systems  Constitutional: Negative.   HENT: Negative.   Eyes: Negative.   Respiratory: Negative.   Cardiovascular: Negative.   Gastrointestinal: Negative.   Genitourinary: Negative.   Musculoskeletal: Positive for joint pain.  Skin: Negative.   Neurological: Negative.   Endo/Heme/Allergies: Negative.   Psychiatric/Behavioral: Negative.     Objective:  Physical Exam   Vitals reviewed. Constitutional: He is oriented to person, place, and time. He appears well-developed and well-nourished.  HENT:  Head: Normocephalic and atraumatic.  Eyes: Conjunctivae and EOM are normal. Pupils are equal, round, and reactive to light.  Neck: Normal range of motion. Neck supple.  Cardiovascular: Normal rate, regular rhythm and intact distal pulses.   Respiratory: Effort normal and breath sounds normal. No respiratory distress.  GI: Soft. Bowel sounds are normal. He exhibits no distension.  Genitourinary:  Genitourinary Comments: deferred  Musculoskeletal:       Right hip: He exhibits decreased range of motion.  Neurological: He is alert and oriented to person, place, and time. He has normal reflexes.  Skin: Skin is warm and dry.  Psychiatric: He has a normal mood and affect. His behavior is normal. Judgment and thought content normal.    Vital signs in last 24 hours: @VSRANGES @  Labs:   There is no height or weight on file to calculate BMI.   Imaging Review Plain radiographs demonstrate severe degenerative joint disease of the right hip(s). The bone quality appears to be adequate for age and reported activity level.  Assessment/Plan:  End stage arthritis, right hip(s)  The patient history, physical examination, clinical judgement of the provider and imaging studies are consistent with end stage degenerative joint disease of the right hip(s) and total hip arthroplasty is deemed medically necessary. The treatment options including medical management, injection therapy, arthroscopy and arthroplasty were discussed at length. The risks and benefits of total hip arthroplasty were presented and reviewed. The risks due to aseptic loosening, infection, stiffness, dislocation/subluxation,  thromboembolic complications and other imponderables were discussed.  The patient acknowledged the explanation, agreed to proceed with the plan and consent was signed. Patient is being  admitted for inpatient treatment for surgery, pain control, PT, OT, prophylactic antibiotics, VTE prophylaxis, progressive ambulation and ADL's and discharge planning.The patient is planning to be discharged home with home health services

## 2016-04-27 NOTE — Evaluation (Signed)
Physical Therapy Evaluation Patient Details Name: Kevin Mcmahon MRN: [id number] DOB: 1956-05-17 Today's Date: 04/27/2016   History of Present Illness  Admitted for Right THA, direct anterior approach;  has a past medical history of Arthritis and Hypertension.  Clinical Impression  Pt is s/p THA resulting in the deficits listed below (see PT Problem List). Overall managing quite well, needs to work on R stance stability at the hip;  Pt will benefit from skilled PT to increase their independence and safety with mobility to allow discharge to the venue listed below.      Follow Up Recommendations Home health PT;Supervision - Intermittent;Other (comment) (May progress well enough to not need HHPT and go to Outpatient)    Equipment Recommendations  Rolling walker with 5" wheels;3in1 (PT)    Recommendations for Other Services OT consult (as ordered)     Precautions / Restrictions Precautions Precautions: None Restrictions Weight Bearing Restrictions: Yes RLE Weight Bearing: Weight bearing as tolerated      Mobility  Bed Mobility Overal bed mobility: Needs Assistance Bed Mobility: Supine to Sit     Supine to sit: Min guard     General bed mobility comments: used bed rail; minguard for safety  Transfers Overall transfer level: Needs assistance Equipment used: Rolling walker (2 wheeled) Transfers: Sit to/from Stand Sit to Stand: Min guard         General transfer comment: Cues for hand placement and safety; good rise  Ambulation/Gait Ambulation/Gait assistance: Min guard;+2 safety/equipment Ambulation Distance (Feet): 80 Feet Assistive device: Rolling walker (2 wheeled) Gait Pattern/deviations: Decreased step length - left;Decreased stance time - right     General Gait Details: Cues for gait sequence and to use RW to take pressure off of sore R hip; tending to flex hips in standing, especially during L foot advancement  Stairs            Wheelchair  Mobility    Modified Rankin (Stroke Patients Only)       Balance                                             Pertinent Vitals/Pain Pain Assessment: 0-10 Pain Score: 5  Pain Location: R LE Pain Descriptors / Indicators: Aching Pain Intervention(s): Limited activity within patient's tolerance;Monitored during session    Home Living Family/patient expects to be discharged to:: Private residence Living Arrangements: Spouse/significant other Available Help at Discharge: Family;Available 24 hours/day Type of Home: House Home Access: Stairs to enter   Enter[employer] CorporationEntrance Stairs-Number of Steps: 1 (this needs to be verified) Home Layout: One level Home Equipment: None      Prior Function Level of Independence: Independent               Hand Dominance        Extremity/Trunk Assessment   Upper Extremity Assessment: Overall WFL for tasks assessed           Lower Extremity Assessment: RLE deficits/detail RLE Deficits / Details: R hip somewhat sore post op, but moving well in all planes       Communication   Communication: No difficulties  Cognition Arousal/Alertness: Awake/alert Behavior During Therapy: WFL for tasks assessed/performed Overall Cognitive Status: Within Functional Limits for tasks assessed                      General Comments  Exercises     Assessment/Plan    PT Assessment Patient needs continued PT services  PT Problem List Decreased strength;Decreased range of motion;Decreased activity tolerance;Decreased mobility;Decreased knowledge of use of DME;Decreased knowledge of precautions;Pain          PT Treatment Interventions DME instruction;Stair training;Gait training;Functional mobility training;Therapeutic activities;Therapeutic exercise;Balance training;Patient/family education    PT Goals (Current goals can be found in the Care Plan section)  Acute Rehab PT Goals Patient Stated Goal: back to work PT Goal  Formulation: With patient Time For Goal Achievement: 05/04/16 Potential to Achieve Goals: Good    Frequency 7X/week   Barriers to discharge        Co-evaluation               End of Session Equipment Utilized During Treatment: Gait belt Activity Tolerance: Patient tolerated treatment well Patient left: in chair;with call bell/phone within reach;with family/visitor present Nurse Communication: Mobility status         Time: 1610-9604 PT Time Calculation (min) (ACUTE ONLY): 13 min   Charges:   PT Evaluation $PT Eval Low Complexity: 1 Procedure     PT G CodesOlen Pel 04/27/2016, 4:46 PM  Van Clines, PT  Acute Rehabilitation Services Pager [phone] Office [phone]

## 2016-04-27 NOTE — Anesthesia Postprocedure Evaluation (Signed)
Anesthesia Post Note  Patient: Kevin Mcmahon  Procedure(s) Performed: Procedure(s) (LRB): RIGHT TOTAL HIP ARTHROPLASTY ANTERIOR APPROACH (Right)  Patient location during evaluation: PACU Anesthesia Type: Spinal Level of consciousness: awake Pain management: pain level controlled Vital Signs Assessment: post-procedure vital signs reviewed and stable Cardiovascular status: stable Postop Assessment: no headache, no backache, spinal receding and no signs of nausea or vomiting Anesthetic complications: no     Last Vitals:  Vitals:   04/27/16 0616 04/27/16 1000  BP: (!) 146/73   Pulse: 67   Resp: 20   Temp: 36.7 C 36.3 C    Last Pain:  Vitals:   04/27/16 0616  TempSrc: Oral   Pain Goal: Patients Stated Pain Goal: 1 (04/27/16 0616)               Yannet Rincon JR,JOHN Susann GivensFRANKLIN

## 2016-04-27 NOTE — Interval H&P Note (Signed)
History and Physical Interval Note:  04/27/2016 7:18 AM  Kevin Mcmahon  has presented today for surgery, with the diagnosis of DEGENERATIVE JOINT DISEASE RIGHT HIP  The various methods of treatment have been discussed with the patient and family. After consideration of risks, benefits and other options for treatment, the patient has consented to  Procedure(s): RIGHT TOTAL HIP ARTHROPLASTY ANTERIOR APPROACH (Right) as a surgical intervention .  The patient's history has been reviewed, patient examined, no change in status, stable for surgery.  I have reviewed the patient's chart and labs.  Questions were answered to the patient's satisfaction.     [name], [name]

## 2016-04-28 ENCOUNTER — Encounter (HOSPITAL_COMMUNITY): Payer: Self-pay | Admitting: Orthopedic Surgery

## 2016-04-28 LAB — CBC
HEMATOCRIT: 36.4 % — AB (ref 39.0–52.0)
Hemoglobin: 11.8 g/dL — ABNORMAL LOW (ref 13.0–17.0)
MCH: 27.8 pg (ref 26.0–34.0)
MCHC: 32.4 g/dL (ref 30.0–36.0)
MCV: 85.8 fL (ref 78.0–100.0)
Platelets: 215 10*3/uL (ref 150–400)
RBC: 4.24 MIL/uL (ref 4.22–5.81)
RDW: 14 % (ref 11.5–15.5)
WBC: 11.4 10*3/uL — AB (ref 4.0–10.5)

## 2016-04-28 LAB — BASIC METABOLIC PANEL
ANION GAP: 8 (ref 5–15)
BUN: 18 mg/dL (ref 6–20)
CALCIUM: 8.5 mg/dL — AB (ref 8.9–10.3)
CHLORIDE: 103 mmol/L (ref 101–111)
CO2: 22 mmol/L (ref 22–32)
Creatinine, Ser: 0.84 mg/dL (ref 0.61–1.24)
GFR calc Af Amer: >60 mL/min (ref >60)
GFR calc non Af Amer: >60 mL/min (ref >60)
GLUCOSE: 118 mg/dL — AB (ref 65–99)
Potassium: 4.2 mmol/L (ref 3.5–5.1)
Sodium: 133 mmol/L — ABNORMAL LOW (ref 135–145)

## 2016-04-28 MED ORDER — SENNA 8.6 MG PO TABS: 2.0000 | ORAL_TABLET | Freq: Every day | ORAL | 1 refills | Status: AC

## 2016-04-28 MED ORDER — ONDANSETRON HCL 4 MG PO TABS: 4.0000 mg | ORAL_TABLET | Freq: Four times a day (QID) | ORAL | 0 refills | Status: AC | PRN

## 2016-04-28 MED ORDER — DOCUSATE SODIUM 100 MG PO CAPS: 100.0000 mg | ORAL_CAPSULE | Freq: Two times a day (BID) | ORAL | 1 refills | Status: AC

## 2016-04-28 MED ORDER — ASPIRIN 81 MG PO CHEW: 81.0000 mg | CHEWABLE_TABLET | Freq: Two times a day (BID) | ORAL | 1 refills | Status: AC

## 2016-04-28 MED ORDER — HYDROCODONE-ACETAMINOPHEN 5-325 MG PO TABS: 1.0000 | ORAL_TABLET | ORAL | 0 refills | Status: AC | PRN

## 2016-04-28 NOTE — Progress Notes (Signed)
Physical Therapy Treatment Patient Details Name: Kevin Mcmahon MRN: [id number] DOB: [birth year] Today's Date: 04/28/2016    History of Present Illness Admitted for Right THA, direct anterior approach;  has a past medical history of Arthritis and Hypertension.    PT Comments    Pt progressing well and performed increased gait and stair training.  PTA issued HEP and reviewed exercises in prep for d/c home.  Will continue efforts this pm if patient remains hospitalized.  Pt is ready to d/c home from a mobility standpoint.    Follow Up Recommendations  Home health PT;Supervision - Intermittent     Equipment Recommendations  Rolling walker with 5" wheels;3in1 (PT)    Recommendations for Other Services       Precautions / Restrictions Precautions Precautions: None Restrictions Weight Bearing Restrictions: No RLE Weight Bearing: Weight bearing as tolerated    Mobility  Bed Mobility               General bed mobility comments: Pt received in recliner on arrival   Transfers Overall transfer level: Needs assistance Equipment used: Rolling walker (2 wheeled) Transfers: Sit to/from Stand Sit to Stand: Modified independent (Device/Increase time)         General transfer comment: Good technique.  Impulsive but safe.    Ambulation/Gait Ambulation/Gait assistance: Supervision Ambulation Distance (Feet): 600 Feet Assistive device: Rolling walker (2 wheeled) Gait Pattern/deviations: Decreased stride length;Decreased stance time - right;Antalgic   Gait velocity interpretation: Below normal speed for age/gender General Gait Details: Cues for weight shifting R and increasing stride on L with continuous step through sequencing.     Stairs Stairs: Yes Stairs assistance: Supervision Stair Management: No rails Number of Stairs: 2 General stair comments: Cues for sequencing and RW placement.    Wheelchair Mobility    Modified Rankin (Stroke Patients Only)        Balance                                    Cognition Arousal/Alertness: Awake/alert Behavior During Therapy: WFL for tasks assessed/performed Overall Cognitive Status: Within Functional Limits for tasks assessed                      Exercises Total Joint Exercises Ankle Circles/Pumps: AROM;Both;10 reps;Supine Quad Sets: AROM;Right;10 reps;Supine Short Arc Quad: AROM;Right;10 reps;Supine Heel Slides: AAROM;Right;10 reps;Supine Hip ABduction/ADduction: AROM;Right;20 reps;Supine;Standing (1x10 supine and 1x10 in standing.  ) Long Arc Quad: AROM;Right;10 reps;Seated Knee Flexion: AROM;Right;10 reps;Standing Marching in Standing: AROM;Right;10 reps;Standing    General Comments        Pertinent Vitals/Pain Pain Assessment: 0-10 Pain Score: 5  Pain Location: R LE Pain Descriptors / Indicators: Aching Pain Intervention(s): Limited activity within patient's tolerance;Repositioned    Home Living                      Prior Function            PT Goals (current goals can now be found in the care plan section) Acute Rehab PT Goals Patient Stated Goal: back to work Progress towards PT goals: Progressing toward goals    Frequency    7X/week      PT Plan Current plan remains appropriate    Co-evaluation             End of Session Equipment Utilized During Treatment: Gait belt Activity Tolerance: Patient tolerated treatment  well Patient left: in chair;with call bell/phone within reach;with family/visitor present     Time: [id number] PT Time Calculation (min) (ACUTE ONLY): 19 min  Charges:  $Gait Training: 8-22 mins                    G Codes:      Florestine Aversimee J Lezley Bedgood 04/28/2016, 9:57 AM  Joycelyn RuaAimee Esiquio Boesen, PTA pager [phone]

## 2016-04-28 NOTE — Progress Notes (Signed)
   Subjective:  Patient reports pain as mild to moderate.  Denies N/V/CP/SOB.  Objective:   VITALS:   Vitals:   04/27/16 1426 04/27/16 2012 04/28/16 0031 04/28/16 0550  BP: (!) 123/56 136/64 (!) 125/54 (!) 130/56  Pulse: 65 75 76 70  Resp: 18 18 17 17   Temp: 97.7 F (36.5 C) 97.7 F (36.5 C) 99.4 F (37.4 C) 98.6 F (37 C)  TempSrc: Oral Oral Oral Oral  SpO2: 95% 98% 94% 93%  Weight:      Height:        NAD ABD soft Sensation intact distally Intact pulses distally Dorsiflexion/Plantar flexion intact Incision: dressing C/D/I Compartment soft   Lab Results  Component Value Date   WBC 11.4 (H) 04/28/2016   HGB 11.8 (L) 04/28/2016   HCT 36.4 (L) 04/28/2016   MCV 85.8 04/28/2016   PLT 215 04/28/2016   BMET    Component Value Date/Time   NA 133 (L) 04/28/2016 0546   K 4.2 04/28/2016 0546   CL 103 04/28/2016 0546   CO2 22 04/28/2016 0546   GLUCOSE 118 (H) 04/28/2016 0546   BUN 18 04/28/2016 0546   CREATININE 0.84 04/28/2016 0546   CALCIUM 8.5 (L) 04/28/2016 0546   GFRNONAA >60 04/28/2016 0546   GFRAA >60 04/28/2016 0546     Assessment/Plan: 1 Day Post-Op   Principal Problem:   Primary osteoarthritis of right hip   WBAT with walker PT/OT PO pain control DVT ppx: ASA, SCDs, TEDs Dispo: D/C home today    Garnet KoyanagiSwinteck, Katreena Schupp Chevy 04/28/2016, 7:48 AM   Samson FredericBrian Ranata Laughery, MD Cell [phone]

## 2016-04-28 NOTE — Discharge Summary (Signed)
Physician Discharge Summary  Patient ID: Kevin Mcmahon MRN: [id number] DOB/AGE: [birth year] 60 y.o.  Admit date: 04/27/2016 Discharge date: 04/28/2016  Admission Diagnoses:  Primary osteoarthritis of right hip  Discharge Diagnoses:  Principal Problem:   Primary osteoarthritis of right hip   Past Medical History:  Diagnosis Date  . Arthritis   . Hypertension     Surgeries: Procedure(s): RIGHT TOTAL HIP ARTHROPLASTY ANTERIOR APPROACH on 04/27/2016   Consultants (if any):   Discharged Condition: Improved  Hospital Course: Kevin Mcmahon is an 59 y.o. male who was admitted 04/27/2016 with a diagnosis of Primary osteoarthritis of right hip and went to the operating room on 04/27/2016 and underwent the above named procedures.    He was given perioperative antibiotics:  Anti-infectives    Start     Dose/Rate Route Frequency Ordered Stop   04/27/16 1330  ceFAZolin (ANCEF) IVPB 2g/100 mL premix     2 g 200 mL/hr over 30 Minutes Intravenous Every 6 hours 04/27/16 1126 04/28/16 0129   04/27/16 0600  ceFAZolin (ANCEF) 3 g in dextrose 5 % 50 mL IVPB     3 g 130 mL/hr over 30 Minutes Intravenous On call to O.R. 04/26/16 1513 04/27/16 0730    .  He was given sequential compression devices, early ambulation, and ASA for DVT prophylaxis.  He benefited maximally from the hospital stay and there were no complications.    Recent vital signs:  Vitals:   04/28/16 0031 04/28/16 0550  BP: (!) 125/54 (!) 130/56  Pulse: 76 70  Resp: 17 17  Temp: 99.4 F (37.4 C) 98.6 F (37 C)    Recent laboratory studies:  Lab Results  Component Value Date   HGB 11.8 (L) 04/28/2016   HGB 13.3 04/17/2016   Lab Results  Component Value Date   WBC 11.4 (H) 04/28/2016   PLT 215 04/28/2016   No results found for: INR Lab Results  Component Value Date   NA 133 (L) 04/28/2016   K 4.2 04/28/2016   CL 103 04/28/2016   CO2 22 04/28/2016   BUN 18 04/28/2016   CREATININE 0.84 04/28/2016    GLUCOSE 118 (H) 04/28/2016    Discharge Medications:     Medication List    TAKE these medications   aspirin 81 MG chewable tablet Chew 1 tablet (81 mg total) by mouth 2 (two) times daily.   docusate sodium 100 MG capsule Commonly known as:  COLACE Take 1 capsule (100 mg total) by mouth 2 (two) times daily.   HYDROcodone-acetaminophen 5-325 MG tablet Commonly known as:  NORCO/VICODIN Take 1-2 tablets by mouth every 4 (four) hours as needed (breakthrough pain).   naproxen sodium 220 MG tablet Commonly known as:  ANAPROX Take 220 mg by mouth 2 (two) times daily with a meal.   olmesartan 20 MG tablet Commonly known as:  BENICAR Take 20 mg by mouth daily.   ondansetron 4 MG tablet Commonly known as:  ZOFRAN Take 1 tablet (4 mg total) by mouth every 6 (six) hours as needed for nausea.   senna 8.6 MG Tabs tablet Commonly known as:  SENOKOT Take 2 tablets (17.2 mg total) by mouth at bedtime.       Diagnostic Studies: Dg Pelvis Portable  Result Date: 04/27/2016 CLINICAL DATA:  Status post right hip replacement EXAM: PORTABLE PELVIS 1-2 VIEWS COMPARISON:  None. FINDINGS: Right hip prosthesis is seen in satisfactory position. No acute fracture is noted. No soft tissue or acute bony abnormality is  seen. IMPRESSION: Status post right hip replacement Electronically Signed   By: Alcide CleverMark  Lukens M.D.   On: 04/27/2016 10:31   Dg C-arm 1-60 Min  Result Date: 04/27/2016 CLINICAL DATA:  Right anterior hip replacement EXAM: OPERATIVE RIGHT HIP (WITH PELVIS IF PERFORMED) 2 VIEWS TECHNIQUE: Fluoroscopic spot image(s) were submitted for interpretation post-operatively. COMPARISON:  None. FINDINGS: Intraoperative spot images demonstrate changes of right hip replacement. Normal AP alignment. No visible hardware or bony complicating feature. IMPRESSION: Right hip replacement without visible complicating feature. Electronically Signed   By: [name] M.D.   On: 04/27/2016 09:38   Dg Hip Operative  Unilat W Or W/o Pelvis Right  Result Date: 04/27/2016 CLINICAL DATA:  Right anterior hip replacement EXAM: OPERATIVE RIGHT HIP (WITH PELVIS IF PERFORMED) 2 VIEWS TECHNIQUE: Fluoroscopic spot image(s) were submitted for interpretation post-operatively. COMPARISON:  None. FINDINGS: Intraoperative spot images demonstrate changes of right hip replacement. Normal AP alignment. No visible hardware or bony complicating feature. IMPRESSION: Right hip replacement without visible complicating feature. Electronically Signed   By: [name] M.D.   On: 04/27/2016 09:38    Disposition: 01-Home or Self Care  Discharge Instructions    Call MD / Call 911    Complete by:  As directed    If you experience chest pain or shortness of breath, CALL 911 and be transported to the hospital emergency room.  If you develope a fever above 101 F, pus (white drainage) or increased drainage or redness at the wound, or calf pain, call your surgeon's office.   Constipation Prevention    Complete by:  As directed    Drink plenty of fluids.  Prune juice may be helpful.  You may use a stool softener, such as Colace (over the counter) 100 mg twice a day.  Use MiraLax (over the counter) for constipation as needed.   Diet - low sodium heart healthy    Complete by:  As directed    Driving restrictions    Complete by:  As directed    No driving for 6 weeks   Increase activity slowly as tolerated    Complete by:  As directed    Lifting restrictions    Complete by:  As directed    No lifting for 6 weeks   TED hose    Complete by:  As directed    Use stockings (TED hose) for 2 weeks on both leg(s).  You may remove them at night for sleeping.      Follow-up Information    Sharaine Delange, Cloyde ReamsBrian Davide, MD. Schedule an appointment as soon as possible for a visit in 2 weeks.   Specialty:  Orthopedic Surgery Why:  For wound re-check Contact information: [address] [place] [address] [phone]             Signed: Garnet KoyanagiSwinteck, Hailly Fess Chanan 04/28/2016, 3:33 PM

## 2016-04-28 NOTE — Plan of Care (Signed)
Problem: Tissue Perfusion: Goal: Risk factors for ineffective tissue perfusion will decrease Outcome: Progressing No S/S of DVT noted  Problem: Activity: Goal: Risk for activity intolerance will decrease Outcome: Progressing tolerated well  Problem: Bowel/Gastric: Goal: Will not experience complications related to bowel motility Outcome: Progressing No bowel issues

## 2016-04-28 NOTE — Progress Notes (Signed)
Pt ready for d/c per MD. PT/OT goals met, walker delivered to room. Discharge instructions and prescriptions reviewed with pt and his wife and daughter, all questions answered. Pt assisted to car via wheelchair.   [place], Kevin Mcmahon

## 2016-04-28 NOTE — Evaluation (Signed)
Occupational Therapy Evaluation/Discharge Patient Details Name: Myrtha MantisJames T Plake MRN: [id number] DOB: [birth year] Today's Date: 04/28/2016    History of Present Illness Admitted for Right THA, direct anterior approach;  has a past medical history of Arthritis and Hypertension.   Clinical Impression   PTA, pt was independent with IADL and was working but required min assist for LB ADL. Pt currently requires supervision for ADL transfers and functional mobility and min assist for LB ADL. Completed education concerning safety with ADL post-operatively, use of ice for pain management, shower transfer, potential use of AE and DME. Pt plans to D/C home with family who can provide intermittenr supervision/assistance. OT feels that this is an appropriate D/C plan. Discussed benefits of shower seat or 3-in-1 for safety but pt declined reporting that his shower is too small and he feels steady enough to stand for showering. Pt has no further OT needs. OT signing off.    Follow Up Recommendations No OT follow up;Supervision - Intermittent    Equipment Recommendations  None recommended by OT (Pt declined shower seat or 3 in 1.)       Precautions / Restrictions Precautions Precautions: None Restrictions Weight Bearing Restrictions: Yes RLE Weight Bearing: Weight bearing as tolerated      Mobility Bed Mobility               General bed mobility comments: Pt received in recliner  Transfers Overall transfer level: Needs assistance Equipment used: Rolling walker (2 wheeled) Transfers: Sit to/from Stand Sit to Stand: Modified independent (Device/Increase time)         General transfer comment: Slightly impulsive but good hand placement.    Balance Overall balance assessment: Needs assistance Sitting-balance support: Feet supported;No upper extremity supported Sitting balance-Leahy Scale: Good     Standing balance support: Bilateral upper extremity supported;During functional  activity Standing balance-Leahy Scale: Fair                              ADL Overall ADL's : Needs assistance/impaired         Upper Body Bathing: Sitting;Independent   Lower Body Bathing: Minimal assistance;Sit to/from stand   Upper Body Dressing : Independent   Lower Body Dressing: Minimal assistance;Sit to/from stand   Toilet Transfer: Supervision/safety;Ambulation;RW   Toileting- Clothing Manipulation and Hygiene: Supervision/safety;Sit to/from stand   Tub/ Shower Transfer: Supervision/safety;Rolling walker   Functional mobility during ADLs: Supervision/safety;Rolling walker General ADL Comments: Educated pt on ADLs post-operatively, fall prevention, shower transfer, use of ice for pain management, and car transfer.     Vision Vision Assessment?: No apparent visual deficits          Pertinent Vitals/Pain Pain Assessment: 0-10 Pain Score: 8  Pain Location: R LE Pain Descriptors / Indicators: Aching Pain Intervention(s): Repositioned;Monitored during session     Hand Dominance Right   Extremity/Trunk Assessment Upper Extremity Assessment Upper Extremity Assessment: Overall WFL for tasks assessed   Lower Extremity Assessment Lower Extremity Assessment: RLE deficits/detail RLE Deficits / Details: Decreased ROM and strength as expected post-operatively.       Communication Communication Communication: No difficulties   Cognition Arousal/Alertness: Awake/alert Behavior During Therapy: WFL for tasks assessed/performed Overall Cognitive Status: Within Functional Limits for tasks assessed                                Home Living Family/patient expects to be discharged to::  Private residence Living Arrangements: Spouse/significant other Available Help at Discharge: Family;Available 24 hours/day Type of Home: House Home Access: Stairs to enter [employer] of Steps: 1   Home Layout: One level     Bathroom Shower/Tub:  Walk-in shower;Door   [employer] Toilet: Standard Bathroom Accessibility: Yes How Accessible: Accessible via walker Home Equipment: Walker - 2 wheels   Additional Comments: Discussed benefits of shower seat or 3 in 1 for sitting in shower, but pt declined reporting that his shower is too small.      Prior Functioning/Environment Level of Independence: Independent                 OT Problem List: Decreased strength;Decreased range of motion;Decreased activity tolerance;Impaired balance (sitting and/or standing);Decreased knowledge of use of DME or AE;Decreased knowledge of precautions;Pain;Decreased safety awareness   OT Treatment/Interventions:      OT Goals(Current goals can be found in the care plan section) Acute Rehab OT Goals Patient Stated Goal: back to work OT Goal Formulation: With patient Time For Goal Achievement: 05/12/16 Potential to Achieve Goals: Good  OT Frequency:      End of Session Equipment Utilized During Treatment: Rolling walker Nurse Communication: Other (comment) (OT complete)  Activity Tolerance: Patient tolerated treatment well Patient left: in chair;with call bell/phone within reach;with family/visitor present   Time: 1351-1400 OT Time Calculation (min): 9 min Charges:  OT General Charges $OT Visit: 1 Procedure OT Evaluation $OT Eval Low Complexity: 1 Procedure  Doristine Section, OTR/L [phone] 04/28/2016, 2:20 PM

## 2016-10-29 DIAGNOSIS — Z96641 Presence of right artificial hip joint: Secondary | ICD-10-CM | POA: Diagnosis not present

## 2016-10-29 DIAGNOSIS — Z471 Aftercare following joint replacement surgery: Secondary | ICD-10-CM | POA: Diagnosis not present

## 2017-04-13 DIAGNOSIS — I10 Essential (primary) hypertension: Secondary | ICD-10-CM | POA: Diagnosis not present

## 2017-04-13 DIAGNOSIS — Z1389 Encounter for screening for other disorder: Secondary | ICD-10-CM | POA: Diagnosis not present

## 2017-04-13 DIAGNOSIS — E782 Mixed hyperlipidemia: Secondary | ICD-10-CM | POA: Diagnosis not present

## 2017-05-07 DIAGNOSIS — Z96641 Presence of right artificial hip joint: Secondary | ICD-10-CM | POA: Diagnosis not present

## 2017-05-07 DIAGNOSIS — Z471 Aftercare following joint replacement surgery: Secondary | ICD-10-CM | POA: Diagnosis not present

## 2017-05-16 DIAGNOSIS — Z1211 Encounter for screening for malignant neoplasm of colon: Secondary | ICD-10-CM | POA: Diagnosis not present

## 2017-12-09 DIAGNOSIS — E782 Mixed hyperlipidemia: Secondary | ICD-10-CM | POA: Diagnosis not present

## 2017-12-09 DIAGNOSIS — R7309 Other abnormal glucose: Secondary | ICD-10-CM | POA: Diagnosis not present

## 2018-04-25 DIAGNOSIS — I10 Essential (primary) hypertension: Secondary | ICD-10-CM | POA: Diagnosis not present

## 2018-04-25 DIAGNOSIS — R7309 Other abnormal glucose: Secondary | ICD-10-CM | POA: Diagnosis not present

## 2018-04-25 DIAGNOSIS — E782 Mixed hyperlipidemia: Secondary | ICD-10-CM | POA: Diagnosis not present

## 2018-04-25 DIAGNOSIS — Z1389 Encounter for screening for other disorder: Secondary | ICD-10-CM | POA: Diagnosis not present

## 2018-07-04 DIAGNOSIS — Z1211 Encounter for screening for malignant neoplasm of colon: Secondary | ICD-10-CM | POA: Diagnosis not present

## 2018-09-27 IMAGING — CR DG PORTABLE PELVIS
1 series · 1 of 1 positions shown · non-contrast
Comparison: None.

CLINICAL DATA: Status post right hip replacement

EXAM:
PORTABLE PELVIS 1-2 VIEWS

[AP]
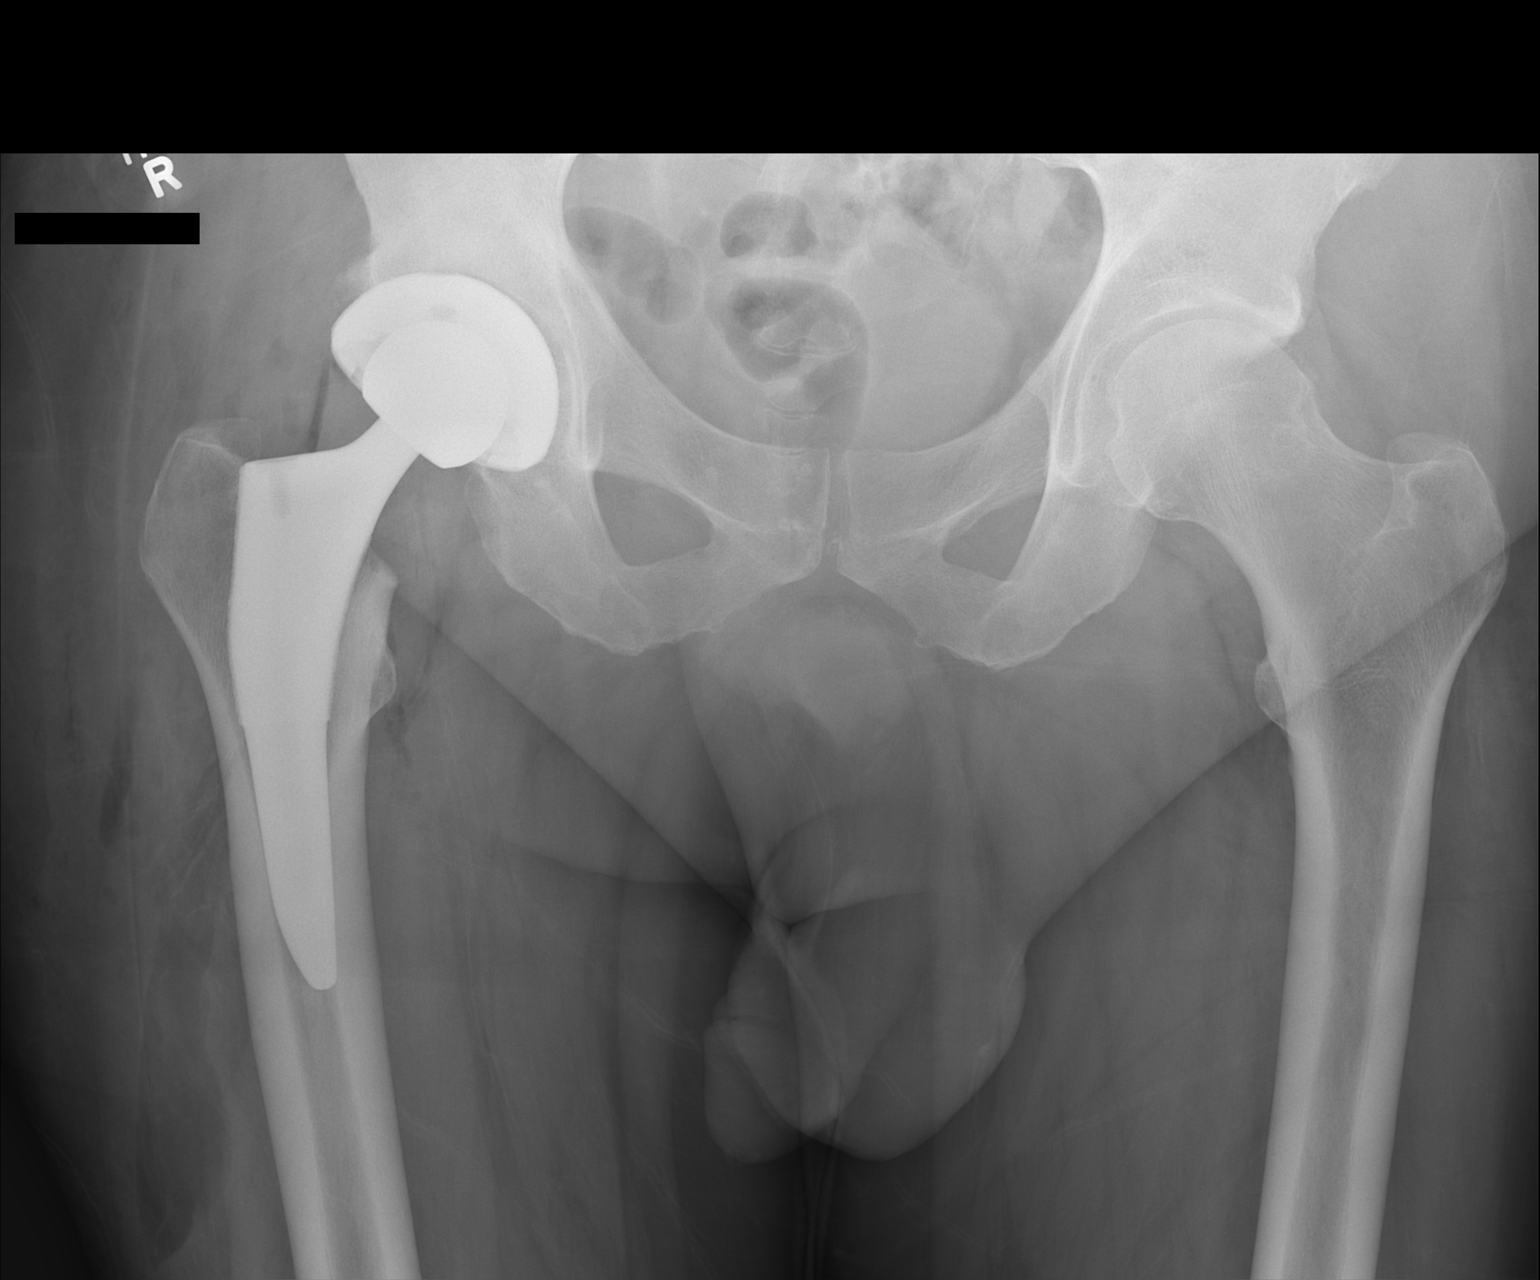

[1 of 1 positions shown; findings below may reference images not displayed]

FINDINGS: Right hip prosthesis is seen in satisfactory position. No acute
fracture is noted. No soft tissue or acute bony abnormality is seen.
IMPRESSION: Status post right hip replacement

## 2018-09-27 IMAGING — RF DG HIP (WITH PELVIS) OPERATIVE*R*
1 series · 2 of 2 positions shown · non-contrast
Comparison: None.

CLINICAL DATA: Right anterior hip replacement

EXAM:
OPERATIVE RIGHT HIP (WITH PELVIS IF PERFORMED) 2 VIEWS
TECHNIQUE: Fluoroscopic spot image(s) were submitted for interpretation
post-operatively.

[Series 1: run · 2 of 2 slices shown]
[im 1/2]
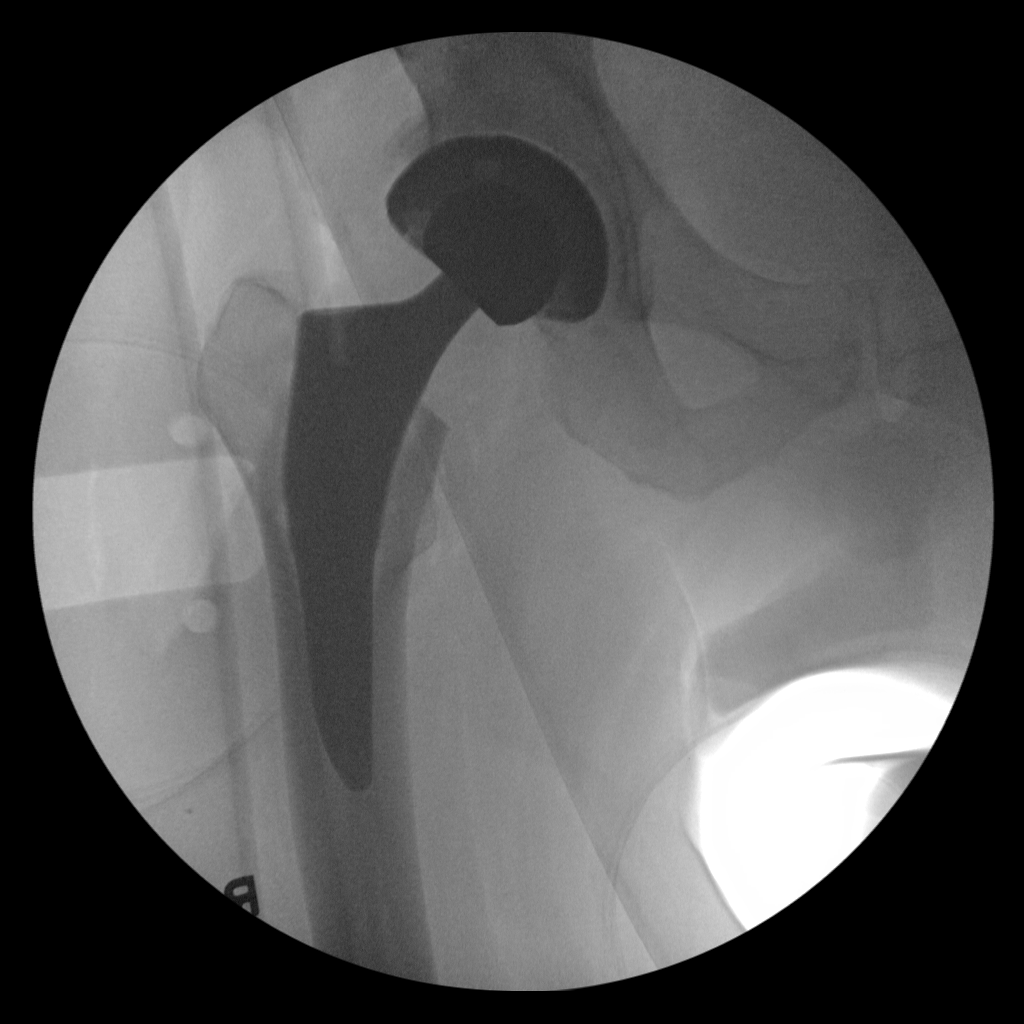
[im 2/2]
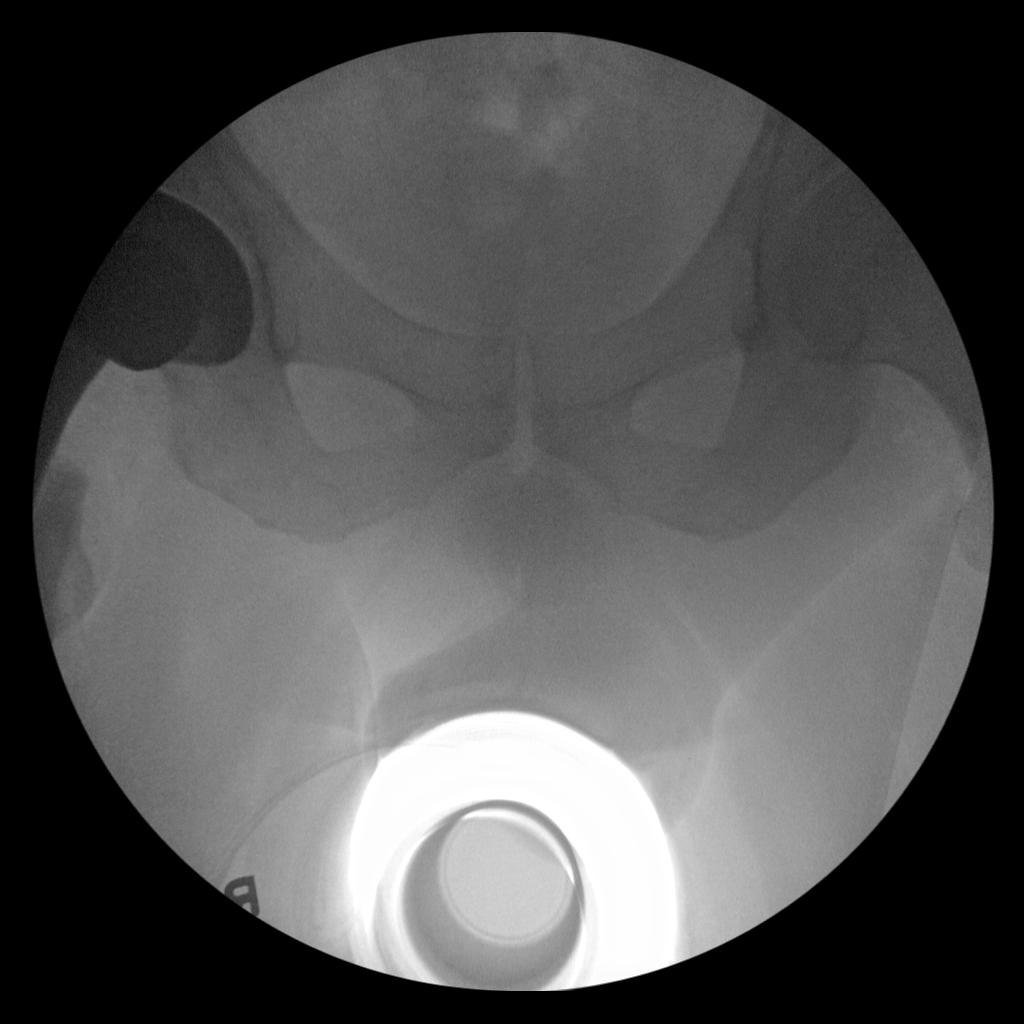

[2 of 2 positions shown; findings below may reference images not displayed]

FINDINGS: Intraoperative spot images demonstrate changes of right hip
replacement. Normal AP alignment. No visible hardware or bony
complicating feature.
IMPRESSION: Right hip replacement without visible complicating feature.

## 2024-07-26 ENCOUNTER — Ambulatory Visit: Admitting: Family Medicine
